# Patient Record
Sex: Male | Born: 1995 | Race: Black or African American | Hispanic: No | Marital: Single | Smoking: Never smoker
Health system: Southern US, Community
[De-identification: ages and names within clinical notes are randomized; demographics above are authoritative.]

## PROBLEM LIST (undated history)

## (undated) DIAGNOSIS — J45909 Unspecified asthma, uncomplicated: Secondary | ICD-10-CM

## (undated) HISTORY — PX: CIRCUMCISION: SHX1350

---
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
## 2016-09-05 ENCOUNTER — Emergency Department: Payer: 59

## 2016-09-05 ENCOUNTER — Emergency Department
Admission: EM | Admit: 2016-09-05 | Discharge: 2016-09-05 | Disposition: A | Discharge status: Home / Self Care | Payer: 59 | Attending: Emergency Medicine | Admitting: Emergency Medicine

## 2016-09-05 ENCOUNTER — Encounter: Payer: Self-pay | Admitting: Emergency Medicine

## 2016-09-05 DIAGNOSIS — Y999 Unspecified external cause status: Secondary | ICD-10-CM | POA: Insufficient documentation

## 2016-09-05 DIAGNOSIS — Y929 Unspecified place or not applicable: Secondary | ICD-10-CM | POA: Diagnosis not present

## 2016-09-05 DIAGNOSIS — S6991XA Unspecified injury of right wrist, hand and finger(s), initial encounter: Secondary | ICD-10-CM | POA: Diagnosis present

## 2016-09-05 DIAGNOSIS — S62336A Displaced fracture of neck of fifth metacarpal bone, right hand, initial encounter for closed fracture: Secondary | ICD-10-CM | POA: Insufficient documentation

## 2016-09-05 DIAGNOSIS — S62339A Displaced fracture of neck of unspecified metacarpal bone, initial encounter for closed fracture: Secondary | ICD-10-CM

## 2016-09-05 DIAGNOSIS — J45909 Unspecified asthma, uncomplicated: Secondary | ICD-10-CM | POA: Insufficient documentation

## 2016-09-05 DIAGNOSIS — W2201XA Walked into wall, initial encounter: Secondary | ICD-10-CM | POA: Insufficient documentation

## 2016-09-05 DIAGNOSIS — Y939 Activity, unspecified: Secondary | ICD-10-CM | POA: Insufficient documentation

## 2016-09-05 MED ORDER — MELOXICAM 15 MG PO TABS: 15.0000 mg | ORAL_TABLET | Freq: Every day | ORAL | 0 refills | Status: DC

## 2016-09-05 MED ORDER — HYDROCODONE-ACETAMINOPHEN 5-325 MG PO TABS: 1.0000 | ORAL_TABLET | ORAL | 0 refills | Status: DC | PRN

## 2016-09-05 NOTE — ED Triage Notes (Signed)
Pt states he hit drywall harder than he realized, he has some swelling on right hand above the pinky.  Pt is in NAD at this time.  He is reporting 8/20 pain and is unable to make a fist, right wrist pulses are 2+

## 2016-09-05 NOTE — ED Provider Notes (Signed)
[facility] Emergency Department Provider Note  ____________________________________________  Time seen: Approximately 10:53 PM  I have reviewed the triage vital signs and the nursing notes.   HISTORY  Chief Complaint Hand Injury    HPI Carl Perkins. is a 21 y.o. male who presents emergency department complaining of pain to the medial aspect of the right hand. Patient states that he became upset and punched a wall. He reports that he is having pain just proximal to the MCP joint of the fifth digit right hand. He denies any gross deformity but does endorse edema. He denies any loss of range of motion to any digit. No numbness or tingling any digit. No medications prior to arrival. No history of previous fracture to the fifth metacarpal.   Past Medical History:  Diagnosis Date  . Asthma     Patient Active Problem List   Diagnosis Date Noted  . New daily persistent headache 12/07/2014  . 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 Neck sprain and strain 12/07/2014    Past Surgical History:  Procedure Laterality Date  . CIRCUMCISION     at birth    Prior to Admission medications   Medication Sig Start Date End Date Taking? Authorizing Provider  aspirin-acetaminophen-caffeine (EXCEDRIN MIGRAINE) [phone] MG per tablet Take by mouth as needed for headache. 2 tabs prn as needed for headache    Historical Provider, MD  HYDROcodone-acetaminophen (NORCO/VICODIN) 5-325 MG tablet Take 1 tablet by mouth every 4 (four) hours as needed for moderate pain. 09/05/16   Delorise Royals Eh Sauseda, PA-C  meloxicam (MOBIC) 15 MG tablet Take 1 tablet (15 mg total) by mouth daily. 09/05/16   Delorise Royals Timo Hartwig, PA-C  naproxen sodium (ANAPROX) 220 MG tablet Take 220 mg by mouth as needed. 1-2 tabs prn for headache    Historical Provider, MD  SUMAtriptan (IMITREX) 25 MG tablet Take 25 mg by mouth every 2 (two) hours as needed for migraine. May repeat in 2 hours if headache  persists or recurs.    Historical Provider, MD  tiZANidine (ZANAFLEX) 4 MG tablet Take 1 tablet at nighttime for headaches 12/07/14   Deetta Perla, MD    Allergies Patient has no known allergies.  Family History  Problem Relation Age of Onset  . Cancer Maternal Grandfather 11  . Congestive Heart Failure Paternal Grandmother 60    Social History Social History  Substance Use Topics  . Smoking status: Never Smoker  . Smokeless tobacco: Never Used  . Alcohol use No     Review of Systems  Constitutional: No fever/chills Cardiovascular: no chest pain. Respiratory: no cough. No SOB. Musculoskeletal: Positive for pain to the fifth metacarpal of the right hand Skin: Negative for rash, abrasions, lacerations, ecchymosis. Neurological: Negative for headaches, focal weakness or numbness. 10-point ROS otherwise negative.  ____________________________________________   PHYSICAL EXAM:  VITAL SIGNS: ED Triage Vitals  Enc Vitals Group     BP 09/05/16 2222 (!) 112/57     Pulse Rate 09/05/16 2222 63     Resp 09/05/16 2222 18     Temp 09/05/16 2222 98.4 F (36.9 C)     Temp Source 09/05/16 2222 Oral     SpO2 09/05/16 2222 98 %     Weight 09/05/16 2200 175 lb (79.4 kg)     Height 09/05/16 2200 6\' 3"  (1.905 m)     Head Circumference --      Peak Flow --      Pain Score --  Pain Loc --      Pain Edu? --      Excl. in GC? --      Constitutional: Alert and oriented. Well appearing and in no acute distress. Eyes: Conjunctivae are normal. PERRL. EOMI. Head: Atraumatic. Neck: No stridor.    Cardiovascular: Normal rate, regular rhythm. Normal S1 and S2.  Good peripheral circulation. Respiratory: Normal respiratory effort without tachypnea or retractions. Lungs CTAB. Good air entry to the bases with no decreased or absent breath sounds. Musculoskeletal: Full range of motion to all extremities. No gross deformities appreciated.No deformities to right hand but inspection. Mild  edema is noted just proximal to the MCP joint fifth digit. Patient is very tender to palpation over the distal fifth metacarpal. No palpable abnormality. Full range of motion to the fifth digit right hand. Sensation intact distal digit. Cap refill less than 2 seconds. Neurologic:  Normal speech and language. No gross focal neurologic deficits are appreciated.  Skin:  Skin is warm, dry and intact. No rash noted. Psychiatric: Mood and affect are normal. Speech and behavior are normal. Patient exhibits appropriate insight and judgement.   ____________________________________________   LABS (all labs ordered are listed, but only abnormal results are displayed)  Labs Reviewed - No data to display ____________________________________________  EKG   ____________________________________________  RADIOLOGY Festus BarrenI, Samirah Scarpati D Davina Howlett, personally viewed and evaluated these images (plain radiographs) as part of my medical decision making, as well as reviewing the written report by the radiologist.  Dg Hand Complete Right  Result Date: 09/05/2016 CLINICAL DATA:  21 y/o M; right fifth metacarpal pain after punching a wall. EXAM: RIGHT HAND - COMPLETE 3+ VIEW COMPARISON:  None. FINDINGS: Minimally displaced acute fracture of the fifth metacarpal distal diaphysis. No other fracture or dislocation identified. Mild soft tissue swelling about site of fracture. IMPRESSION: Minimally displaced acute fracture of the fifth metacarpal distal diaphysis. Electronically Signed   By: Mitzi HansenLance  Furusawa-Stratton M.D.   On: 09/05/2016 22:39    ____________________________________________    PROCEDURES  Procedure(s) performed:    .Splint Application Date/Time: 09/05/2016 11:41 PM Performed by: Gala RomneyUTHRIELL, Melizza Kanode D Authorized by: Gala RomneyUTHRIELL, Kaeden Depaz D   Consent:    Consent obtained:  Verbal   Consent given by:  Patient   Risks discussed:  Pain Pre-procedure details:    Sensation:  Normal Procedure details:     Laterality:  Right   Location:  Wrist   Splint type:  Ulnar gutter   Supplies:  Cotton padding, Ortho-Glass and elastic bandage Post-procedure details:    Pain:  Unchanged   Sensation:  Normal   Patient tolerance of procedure:  Tolerated well, no immediate complications      Medications - No data to display   ____________________________________________   INITIAL IMPRESSION / ASSESSMENT AND PLAN / ED COURSE  Pertinent labs & imaging results that were available during my care of the patient were reviewed by me and considered in my medical decision making (see chart for details).  Review of the [place] CSRS was performed in accordance of the [facility] prior to dispensing any controlled drugs.     Patient's diagnosis is consistent with boxer's fracture to the right hand. X-ray reveals the above diagnosis. Exam is reassuring the patient began neurovascularly intact distal to injury. Patient's hand is splinted with ulnar gutter splint in the emergency department. He'll follow-up with orthopedics for further evaluation and management..Patient is given ED precautions to return to the ED for any worsening or new symptoms.  ____________________________________________  FINAL CLINICAL IMPRESSION(S) / ED DIAGNOSES  Final diagnoses:  Closed boxer's fracture, initial encounter      NEW MEDICATIONS STARTED DURING THIS VISIT:  Discharge Medication List as of 09/05/2016 11:07 PM    START taking these medications   Details  HYDROcodone-acetaminophen (NORCO/VICODIN) 5-325 MG tablet Take 1 tablet by mouth every 4 (four) hours as needed for moderate pain., Starting Thu 09/05/2016, Print    meloxicam (MOBIC) 15 MG tablet Take 1 tablet (15 mg total) by mouth daily., Starting Thu 09/05/2016, Print            This chart was dictated using voice recognition software/Dragon. Despite best efforts to proofread, errors can occur which can change the meaning. Any change was purely  unintentional.    Racheal Patches, PA-C 09/05/16 2342    Arnaldo Natal, MD 09/06/16 [phone]

## 2018-01-11 IMAGING — DX DG HAND COMPLETE 3+V*R*
3 series · 3 of 3 positions shown · non-contrast
Comparison: None.

CLINICAL DATA: 20 y/o M; right fifth metacarpal pain after punching
a wall.

EXAM:
RIGHT HAND - COMPLETE 3+ VIEW

[hand ap]
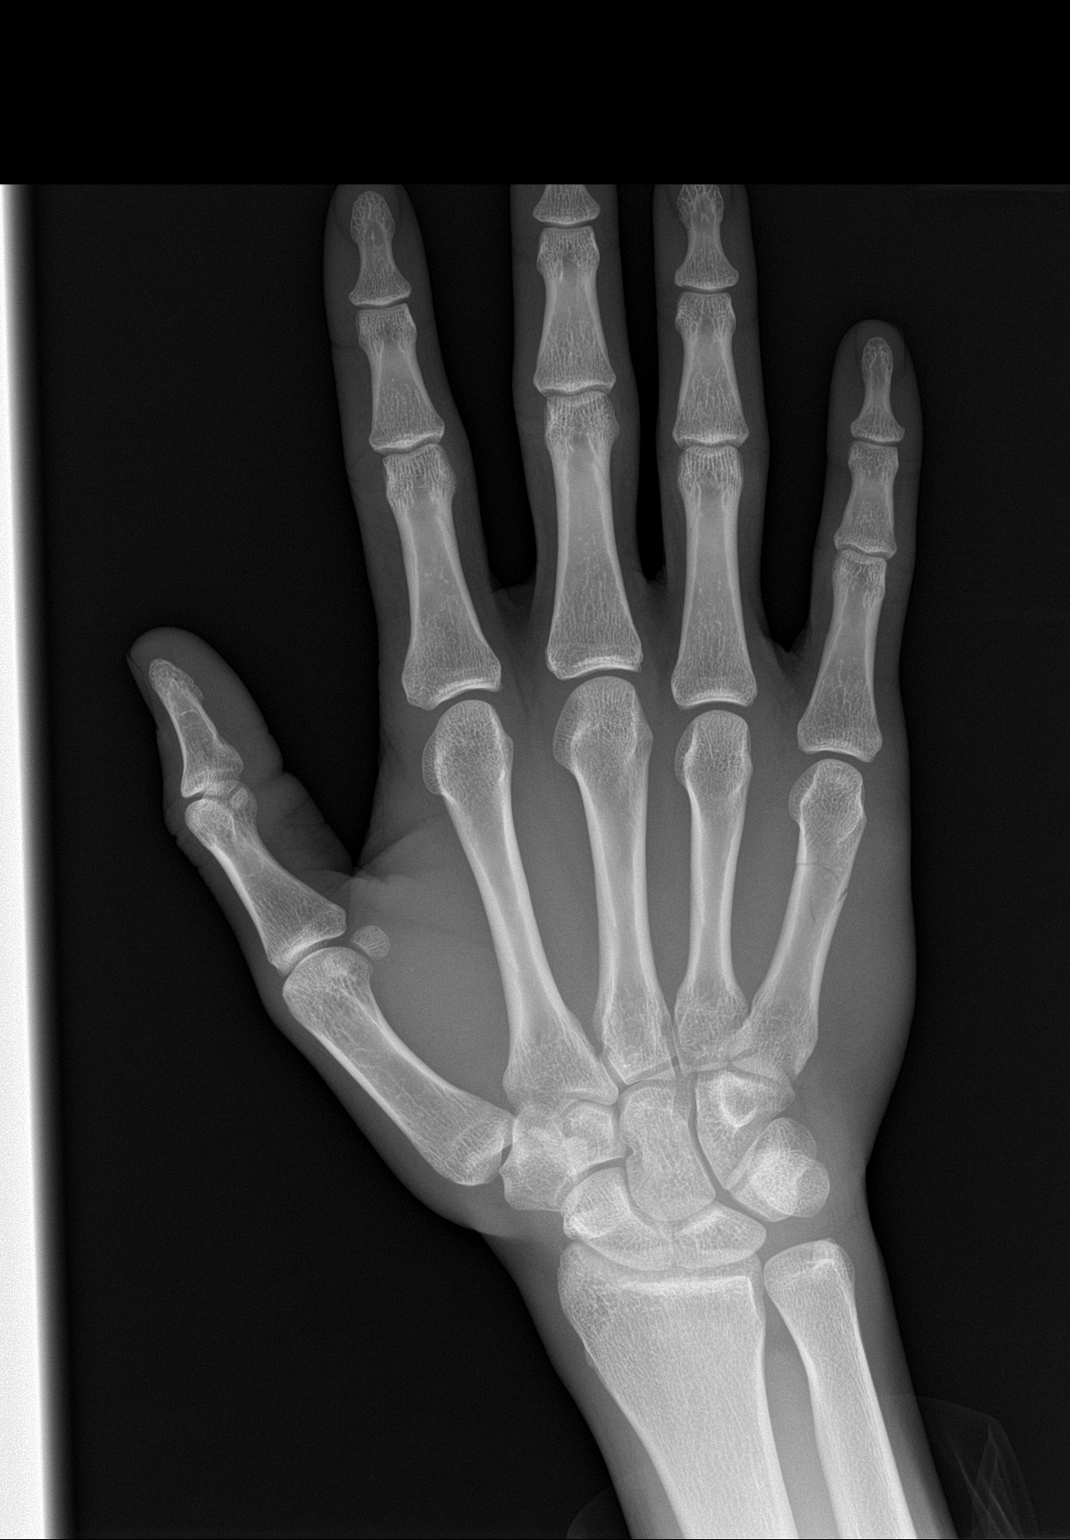

[hand obl]
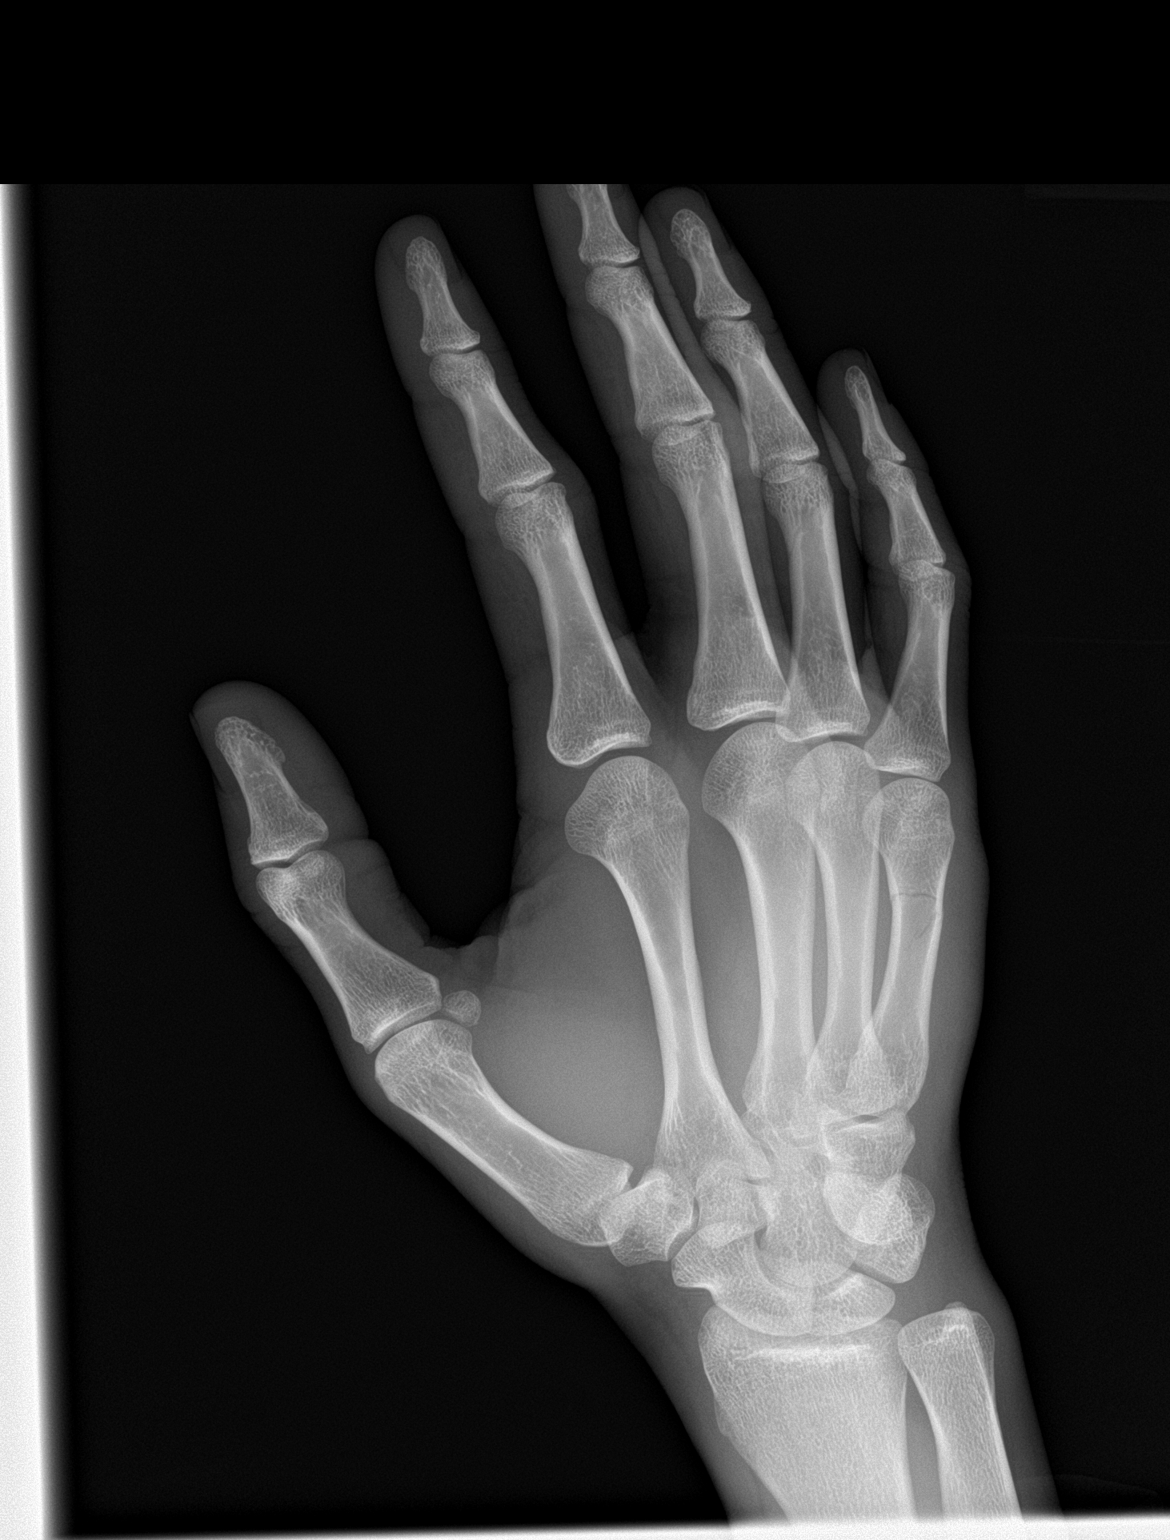

[hand lat]
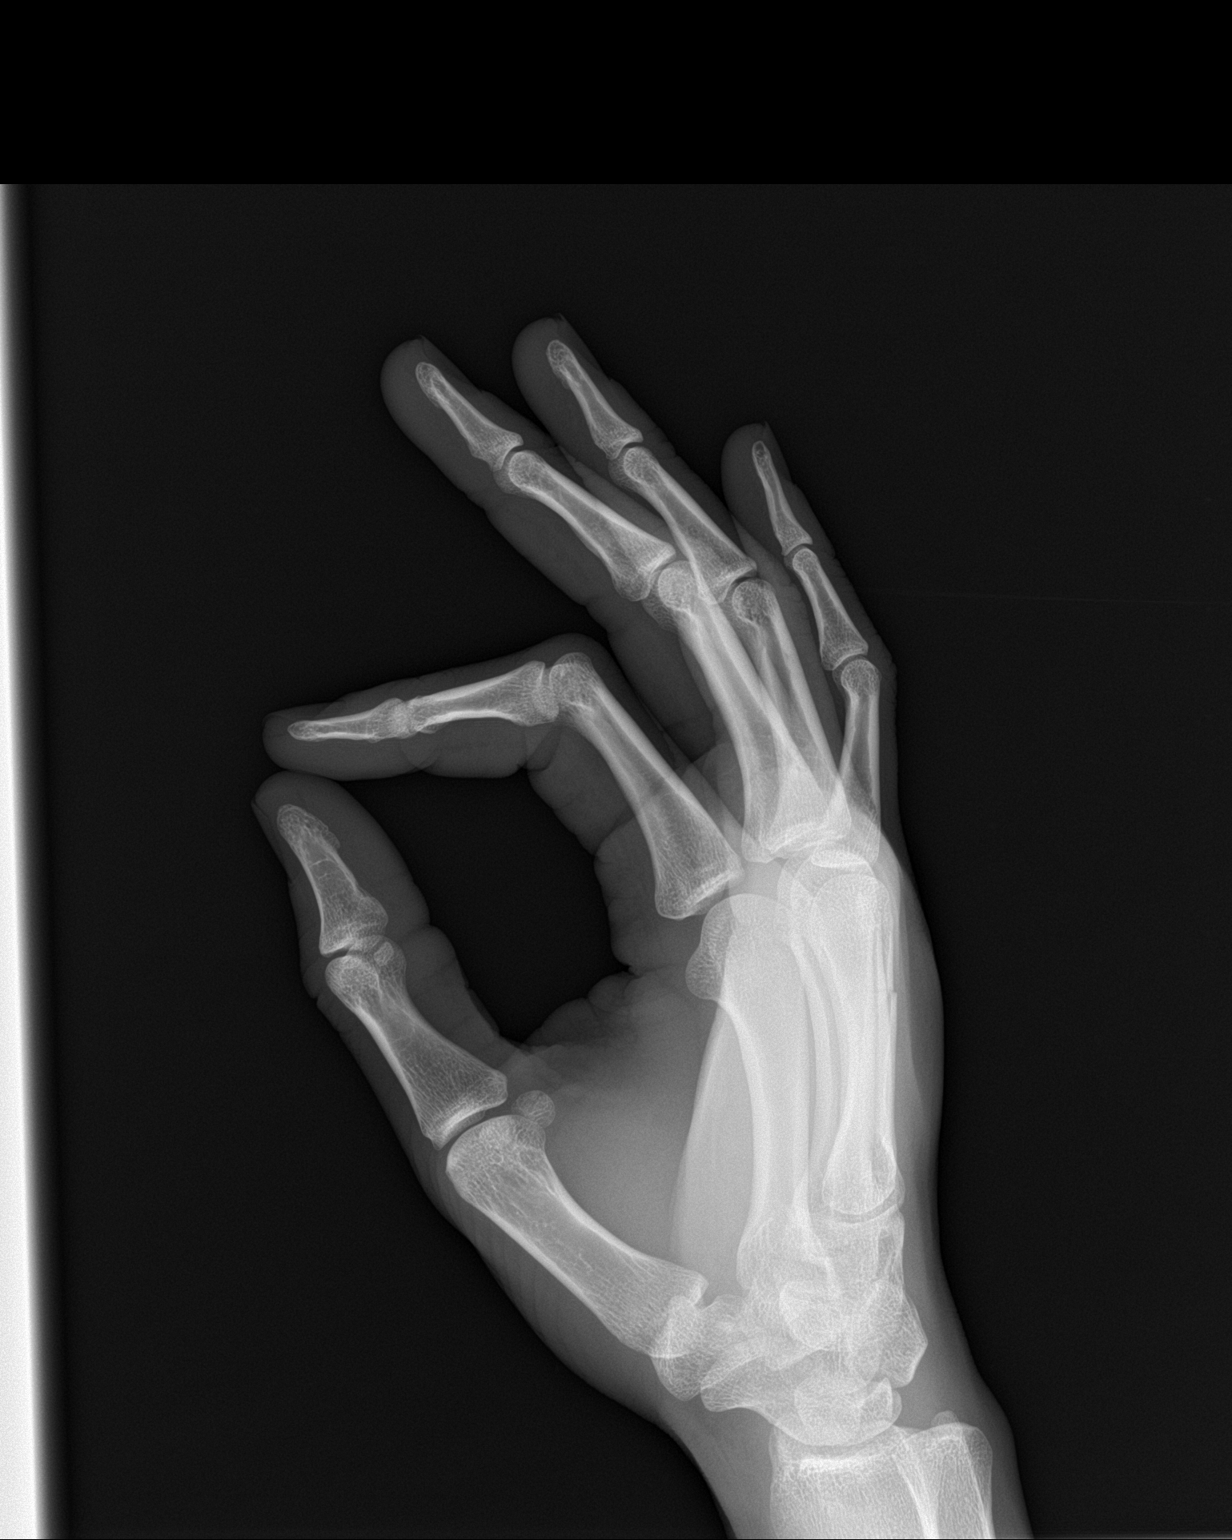

[3 of 3 positions shown; findings below may reference images not displayed]

FINDINGS: Minimally displaced acute fracture of the fifth metacarpal distal
diaphysis. No other fracture or dislocation identified. Mild soft
tissue swelling about site of fracture.
IMPRESSION: Minimally displaced acute fracture of the fifth metacarpal distal
diaphysis.

By: Zafar Wei M.D.

## 2022-12-08 ENCOUNTER — Emergency Department
Admission: EM | Admit: 2022-12-08 | Discharge: 2022-12-08 | Disposition: A | Discharge status: Home / Self Care | Payer: BC Managed Care – PPO | Attending: Emergency Medicine | Admitting: Emergency Medicine

## 2022-12-08 ENCOUNTER — Emergency Department: Payer: BC Managed Care – PPO

## 2022-12-08 ENCOUNTER — Other Ambulatory Visit: Payer: Self-pay

## 2022-12-08 DIAGNOSIS — Z23 Encounter for immunization: Secondary | ICD-10-CM | POA: Diagnosis not present

## 2022-12-08 DIAGNOSIS — Z79899 Other long term (current) drug therapy: Secondary | ICD-10-CM | POA: Diagnosis not present

## 2022-12-08 DIAGNOSIS — E860 Dehydration: Secondary | ICD-10-CM | POA: Diagnosis not present

## 2022-12-08 DIAGNOSIS — Y92410 Unspecified street and highway as the place of occurrence of the external cause: Secondary | ICD-10-CM | POA: Insufficient documentation

## 2022-12-08 DIAGNOSIS — S060XAA Concussion with loss of consciousness status unknown, initial encounter: Secondary | ICD-10-CM

## 2022-12-08 DIAGNOSIS — S61216A Laceration without foreign body of right little finger without damage to nail, initial encounter: Secondary | ICD-10-CM | POA: Diagnosis not present

## 2022-12-08 DIAGNOSIS — S0990XA Unspecified injury of head, initial encounter: Secondary | ICD-10-CM | POA: Diagnosis present

## 2022-12-08 LAB — CBC WITH DIFFERENTIAL/PLATELET
Abs Immature Granulocytes: 0.04 10*3/uL (ref 0.00–0.07)
Basophils Absolute: 0.1 10*3/uL (ref 0.0–0.1)
Basophils Relative: 1 %
Eosinophils Absolute: 0.2 10*3/uL (ref 0.0–0.5)
Eosinophils Relative: 1 %
HCT: 43.1 % (ref 39.0–52.0)
Hemoglobin: 14.1 g/dL (ref 13.0–17.0)
Immature Granulocytes: 0 %
Lymphocytes Relative: 22 %
Lymphs Abs: 2.8 10*3/uL (ref 0.7–4.0)
MCH: 31.5 pg (ref 26.0–34.0)
MCHC: 32.7 g/dL (ref 30.0–36.0)
MCV: 96.2 fL (ref 80.0–100.0)
Monocytes Absolute: 0.7 10*3/uL (ref 0.1–1.0)
Monocytes Relative: 6 %
Neutro Abs: 9.1 10*3/uL — ABNORMAL HIGH (ref 1.7–7.7)
Neutrophils Relative %: 70 %
Platelets: 244 10*3/uL (ref 150–400)
RBC: 4.48 MIL/uL (ref 4.22–5.81)
RDW: 14.1 % (ref 11.5–15.5)
WBC: 12.9 10*3/uL — ABNORMAL HIGH (ref 4.0–10.5)
nRBC: 0 % (ref 0.0–0.2)

## 2022-12-08 LAB — COMPREHENSIVE METABOLIC PANEL
ALT: 17 U/L (ref 0–44)
AST: 32 U/L (ref 15–41)
Albumin: 4.7 g/dL (ref 3.5–5.0)
Alkaline Phosphatase: 63 U/L (ref 38–126)
Anion gap: 13 (ref 5–15)
BUN: 16 mg/dL (ref 6–20)
CO2: 24 mmol/L (ref 22–32)
Calcium: 9.3 mg/dL (ref 8.9–10.3)
Chloride: 98 mmol/L (ref 98–111)
Creatinine, Ser: 0.95 mg/dL (ref 0.61–1.24)
GFR, Estimated: >60 mL/min (ref >60)
Glucose, Bld: 82 mg/dL (ref 70–99)
Potassium: 3.9 mmol/L (ref 3.5–5.1)
Sodium: 135 mmol/L (ref 135–145)
Total Bilirubin: 1.1 mg/dL (ref 0.3–1.2)
Total Protein: 7.9 g/dL (ref 6.5–8.1)

## 2022-12-08 LAB — URINALYSIS, W/ REFLEX TO CULTURE (INFECTION SUSPECTED)
Bacteria, UA: NONE SEEN
Bilirubin Urine: NEGATIVE
Glucose, UA: NEGATIVE mg/dL
Hgb urine dipstick: NEGATIVE
Ketones, ur: 20 mg/dL — AB
Leukocytes,Ua: NEGATIVE
Nitrite: NEGATIVE
Protein, ur: NEGATIVE mg/dL
Specific Gravity, Urine: 1.025 (ref 1.005–1.030)
pH: 6 (ref 5.0–8.0)

## 2022-12-08 LAB — ACETAMINOPHEN LEVEL: Acetaminophen (Tylenol), Serum: <10 ug/mL — ABNORMAL LOW (ref 10–30)

## 2022-12-08 LAB — URINE DRUG SCREEN, QUALITATIVE (ARMC ONLY)
Amphetamines, Ur Screen: NOT DETECTED
Barbiturates, Ur Screen: NOT DETECTED
Benzodiazepine, Ur Scrn: NOT DETECTED
Cannabinoid 50 Ng, Ur ~~LOC~~: POSITIVE — AB
Cocaine Metabolite,Ur ~~LOC~~: NOT DETECTED
MDMA (Ecstasy)Ur Screen: NOT DETECTED
Methadone Scn, Ur: NOT DETECTED
Opiate, Ur Screen: NOT DETECTED
Phencyclidine (PCP) Ur S: NOT DETECTED
Tricyclic, Ur Screen: NOT DETECTED

## 2022-12-08 LAB — ETHANOL: Alcohol, Ethyl (B): <10 mg/dL (ref <10)

## 2022-12-08 LAB — SALICYLATE LEVEL: Salicylate Lvl: <7 mg/dL — ABNORMAL LOW (ref 7.0–30.0)

## 2022-12-08 MED ORDER — LIDOCAINE HCL (PF) 1 % IJ SOLN
5.0000 mL | Freq: Once | INTRAMUSCULAR | Status: AC
  Administered 2022-12-08: 5 mL via INTRADERMAL
  Filled 2022-12-08: qty 5

## 2022-12-08 MED ORDER — TETANUS-DIPHTH-ACELL PERTUSSIS 5-2.5-18.5 LF-MCG/0.5 IM SUSY
0.5000 mL | PREFILLED_SYRINGE | Freq: Once | INTRAMUSCULAR | Status: AC
  Administered 2022-12-08: 0.5 mL via INTRAMUSCULAR
  Filled 2022-12-08: qty 0.5

## 2022-12-08 MED ORDER — CEPHALEXIN 500 MG PO CAPS
500.0000 mg | ORAL_CAPSULE | Freq: Once | ORAL | Status: AC
  Administered 2022-12-08: 500 mg via ORAL
  Filled 2022-12-08: qty 1

## 2022-12-08 MED ORDER — PANTOPRAZOLE SODIUM 40 MG IV SOLR
40.0000 mg | Freq: Once | INTRAVENOUS | Status: AC
  Administered 2022-12-08: 40 mg via INTRAVENOUS
  Filled 2022-12-08: qty 10

## 2022-12-08 MED ORDER — CEPHALEXIN 500 MG PO CAPS: 500.0000 mg | ORAL_CAPSULE | Freq: Three times a day (TID) | ORAL | 0 refills | Status: DC

## 2022-12-08 MED ORDER — FAMOTIDINE 20 MG PO TABS: 20.0000 mg | ORAL_TABLET | Freq: Two times a day (BID) | ORAL | 0 refills | Status: AC

## 2022-12-08 MED ORDER — SODIUM CHLORIDE 0.9 % IV BOLUS
1000.0000 mL | Freq: Once | INTRAVENOUS | Status: AC
  Administered 2022-12-08: 1000 mL via INTRAVENOUS
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
## 2023-07-26 ENCOUNTER — Emergency Department
Admission: EM | Admit: 2023-07-26 | Discharge: 2023-07-26 | Disposition: A | Discharge status: Home / Self Care | Payer: Self-pay | Attending: Emergency Medicine | Admitting: Emergency Medicine

## 2023-07-26 ENCOUNTER — Other Ambulatory Visit: Payer: Self-pay

## 2023-07-26 ENCOUNTER — Emergency Department: Payer: Self-pay

## 2023-07-26 ENCOUNTER — Encounter: Payer: Self-pay | Admitting: Emergency Medicine

## 2023-07-26 DIAGNOSIS — W1831XA Fall on same level due to stepping on an object, initial encounter: Secondary | ICD-10-CM | POA: Insufficient documentation

## 2023-07-26 DIAGNOSIS — S93401A Sprain of unspecified ligament of right ankle, initial encounter: Secondary | ICD-10-CM | POA: Insufficient documentation

## 2023-07-26 MED ORDER — IBUPROFEN 800 MG PO TABS
800.0000 mg | ORAL_TABLET | Freq: Three times a day (TID) | ORAL | 0 refills | Status: AC | PRN
Start: 2023-07-26 — End: ?

## 2023-07-26 MED ORDER — IBUPROFEN 600 MG PO TABS
600.0000 mg | ORAL_TABLET | Freq: Once | ORAL | Status: AC
  Administered 2023-07-26: 600 mg via ORAL
  Filled 2023-07-26: qty 1

## 2023-07-26 NOTE — Discharge Instructions (Addendum)
Up with the podiatrist last on your discharge papers if any continued problems with your foot or ankle.  Ice and elevation to reduce swelling and help with pain.  Ibuprofen was sent to the pharmacy for you to begin taking with food.  Use your crutches at home until you are able to stand without pain.

## 2023-07-26 NOTE — ED Notes (Signed)
See triage note  Presents with pain to right ankle  States he stepped in a hole  Twisted ankle  No swelling  Good pulses

## 2023-07-26 NOTE — ED Provider Notes (Signed)
[facility] Provider Note    Event Date/Time   First MD Initiated Contact with Patient 07/26/23 0710     (approximate)   History   Ankle Pain   HPI  Carl Perkins. is a 27 y.o. male   presents to the ED with complaint of right ankle pain following a fall yesterday afternoon in which she stepped in a hole.  Patient states he has been unable to bear weight since that time.  He took ibuprofen 200 mg once without any relief and has been using ice.  He reports possible ankle sprain back when he was younger but no fracture.      Physical Exam   Triage Vital Signs: ED Triage Vitals [07/26/23 0116]  Encounter Vitals Group     BP 121/68     Systolic BP Percentile      Diastolic BP Percentile      Pulse Rate 62     Resp 18     Temp 98.6 F (37 C)     Temp Source Oral     SpO2 100 %     Weight 180 lb (81.6 kg)     Height 6\' 4"  (1.93 m)     Head Circumference      Peak Flow      Pain Score 9     Pain Loc      Pain Education      Exclude from Growth Chart     Most recent vital signs: Vitals:   07/26/23 0116 07/26/23 0714  BP: 121/68 120/70  Pulse: 62 60  Resp: 18 18  Temp: 98.6 F (37 C) 98 F (36.7 C)  SpO2: 100% 100%     General: Awake, no distress.  CV:  Good peripheral perfusion.  Resp:  Normal effort.  Abd:  No distention.  Other:  Right ankle with soft tissue edema and tenderness noted to the medial aspect without deformity.  Tenderness is noted to the medial aspect of the foot.  Patient is able move all digits without any difficulty motor or sensory function is intact.  Skin is intact.  DP pulses intact.   ED Results / Procedures / Treatments   Labs (all labs ordered are listed, but only abnormal results are displayed) Labs Reviewed - No data to display    RADIOLOGY  X-ray imaging of the right ankle reviewed and intact by myself independent of the radiologist and was negative for fracture or  dislocation.   PROCEDURES:  Critical Care performed:   Procedures   MEDICATIONS ORDERED IN ED: Medications  ibuprofen (ADVIL) tablet 600 mg (600 mg Oral Given 07/26/23 0736)     IMPRESSION / MDM / ASSESSMENT AND PLAN / ED COURSE  I reviewed the triage vital signs and the nursing notes.   Differential diagnosis includes, but is not limited to, ankle sprain, strain, fracture, dislocation.  27 year old male presents to the ED with complaint of ankle injury that occurred yesterday when he stepped into a hole.  Patient states he has not been able to bear weight since that time.  X-rays were negative for fracture or dislocation patient was made aware.  A ibuprofen 600 mg was given to him while in the ED and a Jones wrap was applied for support and protection.  Patient reports that he does have crutches at home and is instructed to use them.  Ice and elevation.  A prescription for continued ibuprofen was sent to the pharmacy.  Patient's presentation is most consistent with acute complicated illness / injury requiring diagnostic workup.  FINAL CLINICAL IMPRESSION(S) / ED DIAGNOSES   Final diagnoses:  Sprain of right ankle, unspecified ligament, initial encounter     Rx / DC Orders   ED Discharge Orders          Ordered    ibuprofen (ADVIL) 800 MG tablet  Every 8 hours PRN        07/26/23 0738             Note:  This document was prepared using Dragon voice recognition software and may include unintentional dictation errors.   Tommi Rumps, PA-C 07/26/23 2841    Jene Every, MD 07/26/23 [phone]

## 2023-07-26 NOTE — ED Triage Notes (Signed)
Pt presents to the ED via POV with complaints of R ankle pain following a fall yesterday afternoon. Pt states he stepped into a hole with the R ankle and has had significant pain all day. He notes taking Ibuprofen around 1600 with no relief.  Mild edema present. A&Ox4 at this time. Denies hitting his head, CP or SOB.

## 2023-08-15 ENCOUNTER — Emergency Department
Admission: EM | Admit: 2023-08-15 | Discharge: 2023-08-15 | Disposition: A | Discharge status: Home / Self Care | Payer: Self-pay | Attending: Emergency Medicine | Admitting: Emergency Medicine

## 2023-08-15 ENCOUNTER — Other Ambulatory Visit: Payer: Self-pay

## 2023-08-15 DIAGNOSIS — Z20822 Contact with and (suspected) exposure to covid-19: Secondary | ICD-10-CM | POA: Insufficient documentation

## 2023-08-15 DIAGNOSIS — J45909 Unspecified asthma, uncomplicated: Secondary | ICD-10-CM | POA: Insufficient documentation

## 2023-08-15 DIAGNOSIS — J069 Acute upper respiratory infection, unspecified: Secondary | ICD-10-CM | POA: Insufficient documentation

## 2023-08-15 LAB — RESP PANEL BY RT-PCR (RSV, FLU A&B, COVID)  RVPGX2
Influenza A by PCR: NEGATIVE
Influenza B by PCR: NEGATIVE
Resp Syncytial Virus by PCR: NEGATIVE
SARS Coronavirus 2 by RT PCR: NEGATIVE

## 2023-08-15 MED ORDER — BENZONATATE 100 MG PO CAPS: 100.0000 mg | ORAL_CAPSULE | Freq: Three times a day (TID) | ORAL | 0 refills | Status: AC | PRN

## 2023-08-15 MED ORDER — ALBUTEROL SULFATE HFA 108 (90 BASE) MCG/ACT IN AERS: 2.0000 | INHALATION_SPRAY | Freq: Four times a day (QID) | RESPIRATORY_TRACT | 2 refills | Status: AC | PRN

## 2023-08-15 NOTE — Discharge Instructions (Signed)
 You were seen in the emergency department today for a cough. Your respiratory panel which includes COVID, RSV and influenza were negative.   A viral cough may last up to 2-3 weeks.   Take tylenol  or ibuprofen  for pain or fever as directed.   Stay hydrated by drinking plenty of fluids to thin mucus. Get adequate amount of sleep and avoid overexertion. Consider a humidifier at night. Warm teas and a spoonful of honey may help reduce cough frequency. Follow up with your primary care provider as needed.   Use throat lozenges or Chloraseptic spray.  Gargle with warm salt water several times daily

## 2023-08-15 NOTE — ED Provider Notes (Signed)
 [facility] Emergency Department Provider Note     Event Date/Time   First MD Initiated Contact with Patient 08/15/23 1550     (approximate)   History   Cough and Chills   HPI  Carl Perkins. is a 28 y.o. male with a history of asthma presents to the ED for evaluation of a nonproductive cough x 3 days.  Denies fever, shortness of breath and chest pain.  Patient reports when he coughs there is some chest tightness.     Physical Exam   Triage Vital Signs: ED Triage Vitals [08/15/23 1455]  Encounter Vitals Group     BP 137/80     Systolic BP Percentile      Diastolic BP Percentile      Pulse Rate 90     Resp 18     Temp 99.6 F (37.6 C)     Temp Source Oral     SpO2 100 %     Weight 170 lb (77.1 kg)     Height 6' 4 (1.93 m)     Head Circumference      Peak Flow      Pain Score      Pain Loc      Pain Education      Exclude from Growth Chart     Most recent vital signs: Vitals:   08/15/23 1455  BP: 137/80  Pulse: 90  Resp: 18  Temp: 99.6 F (37.6 C)  SpO2: 100%    General: Well appearing. Alert and oriented. INAD.  Skin:  Warm, dry and intact. No rashes or lesions noted.     Head:  NCAT.  Eyes:  PERRLA. EOMI.  CV:  Good peripheral perfusion. RRR. No peripheral edema.  RESP:  Normal effort. LCTAB. No retractions.  No wheezing ABD:  No distention. Soft, Non tender.  BACK:  Spinous process is midline without deformity or tenderness. MSK:   Full ROM in all joints. No swelling, deformity or tenderness.  NEURO: Cranial nerves II-XII intact. No focal deficits.   ED Results / Procedures / Treatments   Labs (all labs ordered are listed, but only abnormal results are displayed) Labs Reviewed  RESP PANEL BY RT-PCR (RSV, FLU A&B, COVID)  RVPGX2   No results found.  PROCEDURES:  Critical Care performed: No  Procedures  MEDICATIONS ORDERED IN ED: Medications - No data to display  IMPRESSION / MDM / ASSESSMENT AND PLAN  / ED COURSE  I reviewed the triage vital signs and the nursing notes.                               28 y.o. male presents to the emergency department for evaluation and treatment of cough. See HPI for further details.   Differential diagnosis includes, but is not limited to COVID, viral URI, PNA considered but low suspicion  Patient's presentation is most consistent with acute complicated illness / injury requiring diagnostic workup.  Patient is alert and oriented.  He is hemodynamically stable and afebrile.  Physical exam findings are stated above and overall benign.  Respiratory panel was reassuring.  Lung exam is normal.  No indication for imaging at this time.  Will prescribe Tessalon  for cough.  ED return precautions discussed.  Encouraged to follow-up with PCP as needed.  Patient is in stable and satisfactory condition for discharge home.   FINAL CLINICAL IMPRESSION(S) / ED DIAGNOSES  Final diagnoses:  Viral URI with cough   Rx / DC Orders   ED Discharge Orders          Ordered    benzonatate  (TESSALON  PERLES) 100 MG capsule  3 times daily PRN        08/15/23 1600            Note:  This document was prepared using Dragon voice recognition software and may include unintentional dictation errors.     Margrette, Kerie Badger A, PA-C 08/15/23 1623    Floy Roberts, MD 08/15/23 [phone]

## 2023-08-15 NOTE — ED Triage Notes (Signed)
 Pt reports weakness, cough, chills, fever. Denies being around anyone sick. Reports chest discomfort when coughing. GCS 15. No PMH

## 2023-09-17 ENCOUNTER — Ambulatory Visit: Payer: Self-pay

## 2024-03-30 ENCOUNTER — Other Ambulatory Visit: Payer: Self-pay

## 2024-03-30 ENCOUNTER — Emergency Department
Admission: EM | Admit: 2024-03-30 | Discharge: 2024-03-30 | Disposition: A | Discharge status: Home / Self Care | Payer: Self-pay | Attending: Emergency Medicine | Admitting: Emergency Medicine

## 2024-03-30 DIAGNOSIS — R21 Rash and other nonspecific skin eruption: Secondary | ICD-10-CM | POA: Insufficient documentation

## 2024-03-30 MED ORDER — TRIAMCINOLONE ACETONIDE 0.1 % EX CREA
1.0000 | TOPICAL_CREAM | Freq: Two times a day (BID) | CUTANEOUS | 0 refills | Status: AC
Start: 2024-03-30 — End: ?

## 2024-03-30 NOTE — ED Triage Notes (Signed)
 Patient states he thinks he was bitten by something on the left side of his neck; now having itchy bumps to right hand and bilateral hands.

## 2024-03-30 NOTE — Discharge Instructions (Signed)
 Please apply the steroid cream over the areas of the rash twice a day.  I also recommend taking a daily nondrowsy allergy medication like Claritin, Allegra or Zyrtec.  This will decrease your itching symptoms.  You can also take Benadryl in addition to this if it is still very itchy.  Try to wear loosefitting cotton clothing as this is a breathable fabric and will help minimize sweating which I believe is contributing to this rash.  Return to the emergency department with any worsening symptoms.

## 2024-03-30 NOTE — ED Provider Notes (Signed)
 [facility] Provider Note    Event Date/Time   First MD Initiated Contact with Patient 03/30/24 1525     (approximate)   History   Rash   HPI  Carl Gries. is a 28 y.o. male with PMH of asthma presents for evaluation of a rash on the left side of his neck as well as his arms and hands.  Patient states that the spot on his neck began with what appeared to be a mosquito bite about a week ago.  He noticed some increased swelling and itching.  He tried to treat it with alcohol and that made it worse.  He used ice to help with the swelling.  He states that it is very itchy and mildly painful.  The bumps on his arms and hands began in the last couple days.  Those are also very itchy.  Denies new soaps, detergents and medications.      Physical Exam   Triage Vital Signs: ED Triage Vitals  Encounter Vitals Group     BP 03/30/24 1516 (!) 135/92     Girls Systolic BP Percentile --      Girls Diastolic BP Percentile --      Boys Systolic BP Percentile --      Boys Diastolic BP Percentile --      Pulse Rate 03/30/24 1516 76     Resp 03/30/24 1516 18     Temp 03/30/24 1516 98.4 F (36.9 C)     Temp Source 03/30/24 1516 Oral     SpO2 03/30/24 1516 100 %     Weight --      Height --      Head Circumference --      Peak Flow --      Pain Score 03/30/24 1517 0     Pain Loc --      Pain Education --      Exclude from Growth Chart --     Most recent vital signs: Vitals:   03/30/24 1516  BP: (!) 135/92  Pulse: 76  Resp: 18  Temp: 98.4 F (36.9 C)  SpO2: 100%   General: Awake, no distress.  CV:  Good peripheral perfusion.  Resp:  Normal effort.  Abd:  No distention.  Other:  Rash on neck pictured below, pinpoint erythematous papules on the arms and hands with a greater concentration in the antecubital fossa    ED Results / Procedures / Treatments   Labs (all labs ordered are listed, but only abnormal results are displayed) Labs Reviewed -  No data to display    PROCEDURES:  Critical Care performed: No  Procedures   MEDICATIONS ORDERED IN ED: Medications - No data to display   IMPRESSION / MDM / ASSESSMENT AND PLAN / ED COURSE  I reviewed the triage vital signs and the nursing notes.                             28 year old male presents for evaluation of a rash.  Blood pressure is a little bit elevated otherwise vital signs are stable.  Patient NAD on exam.  Differential diagnosis includes, but is not limited to, urticaria, heat rash, eczema, contact dermatitis.  Patient's presentation is most consistent with acute, uncomplicated illness.  I believe the rash on patient's arms and hands is heat rash and is unrelated to the spot on his neck.  Patient works outside in [employer]  and is often very sweaty.  Discussed symptomatic management using breathable clothing.  The rash on his neck looks similar to urticaria versus eczema.  Will recommend treatment with a topical steroid for all areas.  Also advised he take a daily allergy medication. Patient voiced understanding, all questions were answered and he was stable at discharge.      FINAL CLINICAL IMPRESSION(S) / ED DIAGNOSES   Final diagnoses:  Rash     Rx / DC Orders   ED Discharge Orders          Ordered    triamcinolone  cream (KENALOG ) 0.1 %  2 times daily        03/30/24 1548             Note:  This document was prepared using Dragon voice recognition software and may include unintentional dictation errors.   Cleaster Tinnie LABOR, PA-C 03/30/24 1552    Willo Dunnings, MD 03/30/24 [phone]

## 2024-04-26 ENCOUNTER — Emergency Department
Admission: EM | Admit: 2024-04-26 | Discharge: 2024-04-26 | Disposition: A | Discharge status: Home / Self Care | Payer: Self-pay | Attending: Emergency Medicine | Admitting: Emergency Medicine

## 2024-04-26 ENCOUNTER — Other Ambulatory Visit: Payer: Self-pay

## 2024-04-26 DIAGNOSIS — L739 Follicular disorder, unspecified: Secondary | ICD-10-CM | POA: Insufficient documentation

## 2024-04-26 DIAGNOSIS — L308 Other specified dermatitis: Secondary | ICD-10-CM | POA: Insufficient documentation

## 2024-04-26 MED ORDER — HYDROXYZINE HCL 10 MG PO TABS: 10.0000 mg | ORAL_TABLET | Freq: Three times a day (TID) | ORAL | 0 refills | Status: AC | PRN

## 2024-04-26 MED ORDER — PREDNISONE 10 MG (21) PO TBPK: ORAL_TABLET | ORAL | 0 refills | Status: AC

## 2024-04-26 MED ORDER — BETAMETHASONE DIPROPIONATE 0.05 % EX OINT: TOPICAL_OINTMENT | Freq: Two times a day (BID) | CUTANEOUS | 0 refills | Status: AC

## 2024-04-26 MED ORDER — CEPHALEXIN 500 MG PO CAPS: 500.0000 mg | ORAL_CAPSULE | Freq: Three times a day (TID) | ORAL | 0 refills | Status: AC

## 2024-04-26 NOTE — ED Notes (Signed)
 Patient given discharge instructions including prescriptions x4 and importance of follow up appt as needed with stated understanding. Patient stable and ambulatory with steady even gait on dispo.

## 2024-04-26 NOTE — ED Provider Notes (Signed)
 [facility] Provider Note    Event Date/Time   First MD Initiated Contact with Patient 04/26/24 2055     (approximate)   History   Rash   HPI  Carl Perkins. is a 28 y.o. male history of asthma presents emergency department complaint of itching rash to the arms, neck and now his face for the about a month.  Patient states it itches more if he gets hot and sweats, or if he is in the sunshine.  No one else at home has same rash.  No recent travel or stays in hotels etc.      Physical Exam   Triage Vital Signs: ED Triage Vitals [04/26/24 1802]  Encounter Vitals Group     BP 125/81     Girls Systolic BP Percentile      Girls Diastolic BP Percentile      Boys Systolic BP Percentile      Boys Diastolic BP Percentile      Pulse Rate 60     Resp 17     Temp 98.6 F (37 C)     Temp src      SpO2 99 %     Weight      Height      Head Circumference      Peak Flow      Pain Score 0     Pain Loc      Pain Education      Exclude from Growth Chart     Most recent vital signs: Vitals:   04/26/24 1802  BP: 125/81  Pulse: 60  Resp: 17  Temp: 98.6 F (37 C)  SpO2: 99%     General: Awake, no distress.   CV:  Good peripheral perfusion.  Resp:  Normal effort.  Abd:  No distention.   Other:  Reddened dry looking bumps, some are scaly noted on the hands, arms, and face, none on the palms   ED Results / Procedures / Treatments   Labs (all labs ordered are listed, but only abnormal results are displayed) Labs Reviewed - No data to display   EKG     RADIOLOGY     PROCEDURES:   Procedures  Critical Care:  no Chief Complaint  Patient presents with   Rash      MEDICATIONS ORDERED IN ED: Medications - No data to display   IMPRESSION / MDM / ASSESSMENT AND PLAN / ED COURSE  I reviewed the triage vital signs and the nursing notes.                              Differential diagnosis includes, but is not limited to,  eczema, folliculitis, scabies, syphilis  Patient's presentation is most consistent with acute illness / injury with system symptoms.   Physical exam is consistent with folliculitis and eczema.  Patient had been using triamcinolone  cream without any relief.  Will switch his steroid cream over to betamethasone , will add oral steroid to see if he has any relief, antibiotic for the folliculitis, and Atarax  for itching.  I did discuss care of eczema/rash.  If this is not helping his rash improved he should follow-up with [facility] or [facility].  Both phone numbers were provided for the patient.  He is in agreement this treatment plan.  He was discharged stable condition.      FINAL CLINICAL IMPRESSION(S) / ED DIAGNOSES  Final diagnoses:  Folliculitis  Other eczema     Rx / DC Orders   ED Discharge Orders          Ordered    betamethasone  dipropionate (DIPROLENE ) 0.05 % ointment  2 times daily        04/26/24 2102    predniSONE  (STERAPRED UNI-PAK 21 TAB) 10 MG (21) TBPK tablet        04/26/24 2102    cephALEXin  (KEFLEX ) 500 MG capsule  3 times daily        04/26/24 2102    hydrOXYzine  (ATARAX ) 10 MG tablet  3 times daily PRN        04/26/24 2102             Note:  This document was prepared using Dragon voice recognition software and may include unintentional dictation errors.    Gasper Devere ORN, PA-C 04/26/24 2111    Floy Roberts, MD 04/26/24 2200

## 2024-04-26 NOTE — Discharge Instructions (Signed)
 Use the medications as prescribed.  When you apply the steroid ointment, wait 1 to 2 minutes and then cover and a lotion like CeraVe, Cetaphil, or Eucerin cream. The antibiotic to be taken 3 times daily for a week Steroid pack as directed Atarax , hydroxyzine  is a medication to help with itching.  If this makes you drowsy do not operate heavy machinery.

## 2024-04-26 NOTE — ED Triage Notes (Signed)
 Pt arrives via POV. Pt c/o itching rash to arms, neck and now his face for the past month. PT arrives. AxOx4. States the cream he was prescribed did not help.
